# Patient Record
Sex: Male | Born: 1980 | Race: White | Hispanic: No | Marital: Married | Smoking: Never smoker
Health system: Southern US, Community
[De-identification: ages and names within clinical notes are randomized; demographics above are authoritative.]

## PROBLEM LIST (undated history)

## (undated) DIAGNOSIS — F319 Bipolar disorder, unspecified: Secondary | ICD-10-CM

---
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
## 2003-11-27 ENCOUNTER — Inpatient Hospital Stay (HOSPITAL_COMMUNITY): Admission: RE | Admit: 2003-11-27 | Discharge: 2003-12-02 | Payer: Self-pay | Admitting: Psychiatry

## 2003-11-27 ENCOUNTER — Ambulatory Visit: Payer: Self-pay | Admitting: Psychiatry

## 2003-12-10 ENCOUNTER — Emergency Department (HOSPITAL_COMMUNITY): Admission: EM | Admit: 2003-12-10 | Discharge: 2003-12-10 | Payer: Self-pay | Admitting: Emergency Medicine

## 2003-12-10 ENCOUNTER — Inpatient Hospital Stay (HOSPITAL_COMMUNITY): Admission: RE | Admit: 2003-12-10 | Discharge: 2003-12-13 | Payer: Self-pay | Admitting: Psychiatry

## 2004-03-24 ENCOUNTER — Inpatient Hospital Stay (HOSPITAL_COMMUNITY): Admission: RE | Admit: 2004-03-24 | Discharge: 2004-03-29 | Payer: Self-pay | Admitting: Psychiatry

## 2004-03-24 ENCOUNTER — Ambulatory Visit: Payer: Self-pay | Admitting: Psychiatry

## 2004-03-24 ENCOUNTER — Emergency Department (HOSPITAL_COMMUNITY): Admission: EM | Admit: 2004-03-24 | Discharge: 2004-03-24 | Payer: Self-pay | Admitting: Emergency Medicine

## 2007-01-11 ENCOUNTER — Emergency Department (HOSPITAL_COMMUNITY): Admission: EM | Admit: 2007-01-11 | Discharge: 2007-01-11 | Payer: Self-pay | Admitting: Emergency Medicine

## 2007-03-02 ENCOUNTER — Emergency Department (HOSPITAL_COMMUNITY): Admission: EM | Admit: 2007-03-02 | Discharge: 2007-03-03 | Payer: Self-pay | Admitting: Emergency Medicine

## 2008-09-14 ENCOUNTER — Emergency Department (HOSPITAL_COMMUNITY): Admission: EM | Admit: 2008-09-14 | Discharge: 2008-09-14 | Payer: Self-pay | Admitting: Emergency Medicine

## 2010-01-15 ENCOUNTER — Encounter
Admission: RE | Admit: 2010-01-15 | Discharge: 2010-01-15 | Payer: Self-pay | Source: Home / Self Care | Attending: Family Medicine | Admitting: Family Medicine

## 2010-01-19 ENCOUNTER — Encounter: Payer: Self-pay | Admitting: Family Medicine

## 2010-02-09 ENCOUNTER — Ambulatory Visit: Admit: 2010-02-09 | Payer: Self-pay | Admitting: Sports Medicine

## 2010-03-11 NOTE — Letter (Signed)
Summary: [facility] [place]   Imported By: Marily Memos 01/19/2010 14:36:32  _____________________________________________________________________  External Attachment:    Type:   Image     Comment:   External Document

## 2010-06-25 NOTE — Discharge Summary (Signed)
NAME:  Adam Mccarty, Adam Mccarty NO.:  [id number]   MEDICAL RECORD NO.:  [id number]          PATIENT TYPE:  IPS   LOCATION:  0504                          FACILITY:  [facility]   PHYSICIAN:  Adam Mccarty, M.D. DATE OF BIRTH:  02-Nov-1980   DATE OF ADMISSION:  11/27/2003  DATE OF DISCHARGE:  12/02/2003                                 DISCHARGE SUMMARY   IDENTIFYING DATA:  This is a 30 year old single Caucasian male voluntarily  admitted with a history of depression, crying, stressed out, insomnia, mood  swings, racing thoughts, using 4-5 Klonopin daily, irritable towards others.  First hospitalization to [facility].   MEDICATIONS:  Klonopin 4-5 a day.   ALCOHOL/DRUG HISTORY:  Denies history of alcohol use.  Nonsmoker.   ALLERGIES:  CODEINE.   PHYSICAL EXAMINATION:  Physical and neurological exam within normal limits.   LABORATORY DATA:  Routine admission labs within normal limits.   MENTAL STATUS EXAM:  Alert, cooperative, anxious to talk about situation.  Fair eye contact.  Speech clear.  Mood tense, anxious, dysphoric.  Thought  processes goal directed.  Denied suicidal or homicidal ideation.  No  psychotic symptoms.  Cognitively intact.  Judgment and insight fair.   ADMISSION DIAGNOSES:   AXIS I:  1.  Major depressive disorder.  2.  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   AXIS II:  Deferred.   AXIS III:  None.   AXIS IV:  Significant stressors regarding housing issues, economic and other  psychosocial problems.   AXIS V:   HOSPITAL COURSE:  The patient was admitted and ordered routine p.r.n.  medications and underwent further monitoring.  Was encouraged to participate  in individual, group and milieu therapy.  The patient was evaluated and mood  stabilizing medication of Depakote and Zyprexa were ordered after  risk/benefit ratio and alternative treatments were discussed with patient.  The patient was agreeable.  The patient admitted to homicidal  thoughts,  feeling that he would bash in someone's head if they set him off.  Complained of severe insomnia and significant mood swings with anger, rage  and explosive outbursts.  Also feeling that he would be better off dead.  Describing passive suicidal ideation.  The patient reported visual  hallucinations decreasing and decrease in withdrawal symptoms as he was  gradually stabilized on medications and received Librium p.r.n. for detox  initially.  Seroquel and lithium were optimized.  Levels were closely  monitored.  Family session was held.  Mother reported that patient had  significantly improved, was not talking about any suicidal thoughts.  There  was concern about the patient returning to live with the uncle.  Therefore,  the patient was agreeing to live with the mother and was willing to work on  anger management skills.  The patient's mother and uncle agreed to monitor  patient's medications and that all weapons had been secured.  Mother was  agreeable to monitor medications due to the nature of medications.  This and  other safety issues were clarified.   CONDITION ON DISCHARGE:  The patient was discharged in improved condition.  Mood was  euthymic.  Affect full.  Thought processes goal directed.  Thought  content negative for dangerous ideation or psychotic symptoms.  The patient  was given medication education.   DISCHARGE MEDICATIONS:  1.  Zyprexa Zydis 5 mg q.a.m. and 15 mg q.h.s.,  2.  Seroquel 200 mg q.h.s.  3.  Lithobid 300 mg q.a.m. and 600 mg q.h.s.   FOLLOW UP:  [facility] the day after discharge.   DISCHARGE DIAGNOSES:   AXIS I:  1.  Major depressive disorder.  2.  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   AXIS II:  Deferred.   AXIS III:  None.   AXIS IV:  Significant stressors regarding housing issues, economic and other  psychosocial problems.   AXIS V:  Global Assessment of Functioning on discharge 55.     Adam Mccarty   JEM/MEDQ  D:   01/18/2004  T:  01/19/2004  Job:  [id number]
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
## 2010-06-25 NOTE — Discharge Summary (Signed)
NAME:  Adam Mccarty, Adam Mccarty NO.:  [id number]   MEDICAL RECORD NO.:  [id number]          PATIENT TYPE:  IPS   LOCATION:  0301                          FACILITY:  [facility]   PHYSICIAN:  Geoffery Lyons, M.D.      DATE OF BIRTH:  [birth year]   DATE OF ADMISSION:  03/24/2004  DATE OF DISCHARGE:  03/29/2004                                 DISCHARGE SUMMARY   CHIEF COMPLAINT/HISTORY OF PRESENT ILLNESS:  This is the third admission to  [facility] for this 30 year old single white male  voluntarily admitted with history of homicidal thoughts.  Say something, he  will resort to violence.  Increased mood swings, having paranoid ideas,  feels like people were talking about him and did not trust anyone.  Reporting increases of benzodiazepine taking 3 a day for a month.  He took 5  the day before just to get a buzz before he was admitted.  Using cocaine and  marijuana, sleeping fairly well.   PAST PSYCHIATRIC HISTORY:  Third time a [employer].  He was to  go to [facility] on his last discharge, but he was not able to do so due  to transportation.  He was admitted in November 2005 for benzodiazepine  abuse, racing thoughts and irritability.  He was seen in October 2005 for  Klonopin and alcohol use.  He reports he is still bipolar.   ALCOHOL OR DRUG HISTORY:  Persistent use of alcohol, benzodiazepine,  marijuana and cocaine.   PAST MEDICAL HISTORY:  Noncontributory.   MEDICATIONS:  He has been on lithium in the past.  No current medications.   PHYSICAL EXAMINATION:  Performed which did not show any acute findings.   LABORATORY WORKUP:  Urine drug screen positive for cocaine, marijuana,  benzodiazepine.  CBC within normal limits.  CMET was within normal limits.  TSH 1.377.   MENTAL STATUS EXAM:  Revealed an alert male, cooperative.  Fair eye contact.  Somewhat disheveled.  Speech was clear.  Feeling very helpless, hopeless,  labile.  Thought  processes were logical, coherent and relevant.  There was  some paranoid ideation, some paranoid thoughts, some homicidal thoughts but  very vague.  No suicidal ideations, no hallucinations.  Cognition well  preserved.   ADMISSION DIAGNOSES:   AXIS I:  1.  Bipolar disorder, rule out psychotic features.  2.  Polysubstance abuse.   AXIS II:  No diagnosis.   AXIS III:  No diagnosis.   AXIS IV:  Moderate.   AXIS V:  Global assessment of function on admission 25, highest in the last  year 60.   COURSE IN HOSPITAL:  He was admitted, started in individual and group  psychotherapy.  He was given Ambien for sleep.  He was given Librium as  needed for symptoms of withdrawal.  He started on Depakote ER 250 in the  morning and 500 at night.  Risperdal 0.25 in the morning and 0.5 at night.  He was given Rozarem for sleep.  He endorsed persistent difficulty with  mood, endorsed irritability, anger, easy loss of  control.  He claimed that  the lithium caused tremors, but he felt better.  He wanted to try some other  medications. He tolerated the combination of Depakote and Risperdal well.  He had an episode where he reverted to his old behavior.  He got medication,  and he felt much better.  Overall he was able to tolerate the medication  well.  He had work on Building surveyor, work on Pharmacologist, stress  management, conflicts and solutions, and on March 29, 2004 he was in full  contact with reality.  There were no suicidal ideas, no homicidal ideation,  no hallucinations, no delusions.  He was feeling much better.  There was no  evidence of mood swings, appropriate control of anger, overall much  improved.  He was able to talk with the mother and her boyfriend.  They were  very supportive of him.  As he was stable enough, we went ahead and  discharged him to outpatient followup.   DISCHARGE DIAGNOSES:   AXIS I:  1.  Bipolar disorder, not otherwise specified.  2.  Polysubstance  abuse.   AXIS II:  No diagnosis.   AXIS III:  No diagnosis.   AXIS IV:  Moderate.   AXIS IV:  Global assessment of function on discharge 50-55.   DISCHARGE MEDICATIONS:  1.  Depakote ER 250 mg 1 in the morning and 2 at night.  2.  Risperdal 0.5 mg 1/2 table twice a day and 1 at night.  3.  Rozarem 8 mg at night.   FOLLOW UP:  [facility].   Valproic acid level upon discharge was 51.8.      IL/MEDQ  D:  04/27/2004  T:  04/28/2004  Job:  [id number]

## 2010-10-28 LAB — DIFFERENTIAL
Basophils Absolute: 0
Basophils Relative: 1
Eosinophils Absolute: 0.2
Lymphocytes Relative: 36
Lymphs Abs: 2.2
Monocytes Relative: 12
Neutro Abs: 3

## 2010-10-28 LAB — BASIC METABOLIC PANEL
BUN: 9
Creatinine, Ser: 1.14
GFR calc non Af Amer: >60
Glucose, Bld: 81

## 2010-10-28 LAB — CBC
HCT: 42.6
Hemoglobin: 14.5
RBC: 4.99
WBC: 6.2

## 2010-10-28 LAB — HEPATIC FUNCTION PANEL
AST: 22
Albumin: 4.2
Alkaline Phosphatase: 46
Total Bilirubin: 0.9
Total Protein: 6.4

## 2011-02-16 ENCOUNTER — Encounter (HOSPITAL_COMMUNITY): Payer: Self-pay | Admitting: *Deleted

## 2011-02-16 ENCOUNTER — Emergency Department (HOSPITAL_COMMUNITY)
Admission: EM | Admit: 2011-02-16 | Discharge: 2011-02-16 | Disposition: A | Discharge status: Home / Self Care | Payer: BC Managed Care – PPO | Attending: Emergency Medicine | Admitting: Emergency Medicine

## 2011-02-16 ENCOUNTER — Emergency Department (HOSPITAL_COMMUNITY): Payer: BC Managed Care – PPO

## 2011-02-16 DIAGNOSIS — M549 Dorsalgia, unspecified: Secondary | ICD-10-CM | POA: Insufficient documentation

## 2011-02-16 MED ORDER — IBUPROFEN 800 MG PO TABS: 800.0000 mg | ORAL_TABLET | Freq: Three times a day (TID) | ORAL | Status: AC

## 2011-02-16 MED ORDER — HYDROCODONE-IBUPROFEN 7.5-200 MG PO TABS: 1.0000 | ORAL_TABLET | Freq: Four times a day (QID) | ORAL | Status: AC | PRN

## 2011-02-16 MED ORDER — HYDROCODONE-ACETAMINOPHEN 5-325 MG PO TABS
2.0000 | ORAL_TABLET | Freq: Once | ORAL | Status: AC
  Administered 2011-02-16: 2 via ORAL
  Filled 2011-02-16: qty 2

## 2011-02-16 NOTE — ED Notes (Signed)
Pt c/o mid back pain described as stabbing pain. Pt states pain has been persistent for 3 months. Pt denies any known injury but states he did have a job that required a lot of lifting.

## 2011-02-16 NOTE — ED Provider Notes (Addendum)
History   This chart was scribed for [employer]. Colon Branch, MD scribed by Magnus Sinning. The patient was seen in room APA03/APA03 seen at 9:59.    CSN: [id number]  Arrival date & time 02/16/11  0906   First MD Initiated Contact with Patient 02/16/11 [phone]      Chief Complaint  Patient presents with  . Back Pain    (Consider location/radiation/quality/duration/timing/severity/associated sxs/prior treatment) HPI Adam Mccarty is a 31 y.o. male who presents to the Emergency Department complaining of gradually worsening constant back pain located on the paraspinal muscle on his back onset 6 months ago. Pt says he works nights and that when he wakes up in the evening it hurts really bad and that he has difficulty sleeping because of uncomfortable pain. He says that he has been taking ibuprofen 2x a day with mild improvement, with it last being taken at 8 AM this morning. He adds that his pain is aggravated when he bends or twists, describing it as a "sharp pain that shoots to his chest." He attributes his back pain to a possible injury from doing heavy lifting at work in the summer and assumes that he may have injured his back doing that. Denies any other pertinent symptoms.  History reviewed. No pertinent past medical history.  History reviewed. No pertinent past surgical history.  History reviewed. No pertinent family history.  History  Substance Use Topics  . Smoking status: Never Smoker   . Smokeless tobacco: Not on file  . Alcohol Use: No      Review of Systems 10 Systems reviewed and are negative for acute change except as noted in the HPI. Allergies  Review of patient's allergies indicates no known allergies.  Home Medications  No current outpatient prescriptions on file.  BP 136/78  Pulse 88  Temp(Src) 97.9 F (36.6 C) (Oral)  Resp 16  Ht 6' (1.829 m)  Wt 165 lb (74.844 kg)  BMI 22.38 kg/m2  SpO2 97%  Physical Exam  Nursing note and vitals reviewed. Constitutional:  He is oriented to person, place, and time. He appears well-developed and well-nourished. No distress.  HENT:  Head: Normocephalic and atraumatic.  Mouth/Throat: Oropharynx is clear and moist.  Eyes: EOM are normal. Pupils are equal, round, and reactive to light.  Neck: Neck supple. No tracheal deviation present.  Cardiovascular: Normal rate, regular rhythm and normal heart sounds.  Exam reveals no gallop and no friction rub.   No murmur heard. Pulmonary/Chest: Effort normal and breath sounds normal. No respiratory distress.  Abdominal: Soft. Bowel sounds are normal. He exhibits no distension.  Musculoskeletal: Normal range of motion. He exhibits no edema.       Focal tenderness at the T7-8 paraspinal spinal interface and discomfort is tracked along the T8 rib.   Neurological: He is alert and oriented to person, place, and time. No sensory deficit.  Skin: Skin is warm and dry.  Psychiatric: He has a normal mood and affect. His behavior is normal.    ED Course  Procedures (including critical care time) DIAGNOSTIC STUDIES: Oxygen Saturation is 97% on room air, normal by my interpretation.    COORDINATION OF CARE:  Dg Thoracic Spine 2 View  02/16/2011  *RADIOLOGY REPORT*  Clinical Data: 31 year old male with pain.  THORACIC SPINE - 2 VIEW  Comparison: None.  Findings: Hypoplastic twelfth ribs, otherwise normal thoracic segmentation. Cervicothoracic junction alignment is within normal limits.  Thoracic vertebral height and alignment is within normal limits.  Grossly normal visualized  thoracic visceral contours.  IMPRESSION: No acute osseous abnormality in the thoracic spine.  Original Report Authenticated By: Harley Hallmark, M.D.  New Prescriptions   HYDROCODONE-IBUPROFEN (VICOPROFEN) 7.5-200 MG PER TABLET    Take 1 tablet by mouth every 6 (six) hours as needed for pain.   IBUPROFEN (ADVIL,MOTRIN) 800 MG TABLET    Take 1 tablet (800 mg total) by mouth 3 (three) times daily.     MDM    Patient with recurrent pain to right back. Worse last few days. Does heavy lifting at work. Infrascapular pain.  Analgesic given with some relief. Xray negative for acute process.Pt feels improved after observation and/or treatment in ED.Pt stable in ED with no significant deterioration in condition.The patient appears reasonably screened and/or stabilized for discharge and I doubt any other medical condition or other [facility] requiring further screening, evaluation, or treatment in the ED at this time prior to discharge.  I personally performed the services described in this documentation, which was scribed in my presence. The recorded information has been reviewed and considered.  MDM Reviewed: nursing note and vitals Interpretation: x-ray         Nicoletta Dress. Colon Branch, MD 02/16/11 1139  Nicoletta Dress. Colon Branch, MD 02/16/11 1156  Nicoletta Dress. Colon Branch, MD 02/16/11 1204

## 2013-06-02 ENCOUNTER — Encounter (HOSPITAL_COMMUNITY): Payer: Self-pay | Admitting: Emergency Medicine

## 2013-06-02 ENCOUNTER — Emergency Department (HOSPITAL_COMMUNITY)
Admission: EM | Admit: 2013-06-02 | Discharge: 2013-06-02 | Disposition: A | Discharge status: Home / Self Care | Payer: BC Managed Care – PPO | Attending: Emergency Medicine | Admitting: Emergency Medicine

## 2013-06-02 DIAGNOSIS — F32A Depression, unspecified: Secondary | ICD-10-CM

## 2013-06-02 DIAGNOSIS — F3289 Other specified depressive episodes: Secondary | ICD-10-CM | POA: Insufficient documentation

## 2013-06-02 DIAGNOSIS — F1110 Opioid abuse, uncomplicated: Secondary | ICD-10-CM | POA: Insufficient documentation

## 2013-06-02 DIAGNOSIS — F329 Major depressive disorder, single episode, unspecified: Secondary | ICD-10-CM | POA: Insufficient documentation

## 2013-06-02 DIAGNOSIS — F1210 Cannabis abuse, uncomplicated: Secondary | ICD-10-CM | POA: Insufficient documentation

## 2013-06-02 HISTORY — DX: Bipolar disorder, unspecified: F31.9

## 2013-06-02 LAB — CBC
HCT: 41.2 % (ref 39.0–52.0)
HEMOGLOBIN: 14.2 g/dL (ref 13.0–17.0)
MCH: 29.2 pg (ref 26.0–34.0)
MCHC: 34.5 g/dL (ref 30.0–36.0)
MCV: 84.6 fL (ref 78.0–100.0)
Platelets: 148 10*3/uL — ABNORMAL LOW (ref 150–400)
RBC: 4.87 MIL/uL (ref 4.22–5.81)
RDW: 13.3 % (ref 11.5–15.5)
WBC: 4.1 10*3/uL (ref 4.0–10.5)

## 2013-06-02 LAB — RAPID URINE DRUG SCREEN, HOSP PERFORMED
AMPHETAMINES: NOT DETECTED
BARBITURATES: NOT DETECTED
Benzodiazepines: NOT DETECTED
COCAINE: NOT DETECTED
Opiates: POSITIVE — AB
TETRAHYDROCANNABINOL: POSITIVE — AB

## 2013-06-02 LAB — COMPREHENSIVE METABOLIC PANEL
ALBUMIN: 4 g/dL (ref 3.5–5.2)
ALK PHOS: 52 U/L (ref 39–117)
ALT: 13 U/L (ref 0–53)
AST: 19 U/L (ref 0–37)
BILIRUBIN TOTAL: 0.2 mg/dL — AB (ref 0.3–1.2)
BUN: 10 mg/dL (ref 6–23)
CALCIUM: 9.2 mg/dL (ref 8.4–10.5)
CO2: 27 mEq/L (ref 19–32)
Chloride: 108 mEq/L (ref 96–112)
Creatinine, Ser: 1.07 mg/dL (ref 0.50–1.35)
GFR calc Af Amer: >90 mL/min (ref >90)
GLUCOSE: 96 mg/dL (ref 70–99)
Potassium: 4.5 mEq/L (ref 3.7–5.3)
Sodium: 146 mEq/L (ref 137–147)
TOTAL PROTEIN: 6.8 g/dL (ref 6.0–8.3)

## 2013-06-02 LAB — ETHANOL: ALCOHOL ETHYL (B): 107 mg/dL — AB (ref 0–11)

## 2013-06-02 LAB — SALICYLATE LEVEL: Salicylate Lvl: 0.4 mg/dL — ABNORMAL LOW (ref 2.8–20.0)

## 2013-06-02 LAB — ACETAMINOPHEN LEVEL: Acetaminophen (Tylenol), Serum: <15 ug/mL (ref 10–30)

## 2013-06-02 NOTE — Discharge Instructions (Signed)
[facility]

## 2013-06-02 NOTE — ED Notes (Signed)
Tele psych being performed at present time, [facility] remains at bedside,
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
## 2013-06-02 NOTE — ED Provider Notes (Signed)
CSN: [id number]     Arrival date & time 06/02/13  [id number] History  This chart was scribed for Donnetta HutchingBrian Liyla Radliff, MD by Leone PayorSonum Patel, ED Scribe. This patient was seen in room [facility] and the patient's care was started 9:15 AM.    Chief Complaint  Patient presents with  . V70.1      The history is provided by the patient. No language interpreter was used.    HPI Comments: Adam Mccarty is a 33 y.o. male who presents to the Emergency Department under IVC escorted by [employer] today. Per IVC papers, patient was drinking last night while watching a war movie. Patient became depressed, had SI and auditory hallucinations. Per papers, patient was hysterical one minute and fine the next. Patient states he normally does not abuse alcohol. He has a history bipolar disorder and is [facility]. He is supposed to be taking Prozac but states he quit taking this. He admits to using marijuana and pain pills but states he has not used these in the past 1 week. Patient denies SI, HI currently. Patient works in [employer].   Past Medical History  Diagnosis Date  . Bipolar 1 disorder    History reviewed. No pertinent past surgical history. History reviewed. No pertinent family history. History  Substance Use Topics  . Smoking status: Never Smoker   . Smokeless tobacco: Not on file  . Alcohol Use: Yes     Comment: social drinker. last use last night    Review of Systems  A complete 10 system review of systems was obtained and all systems are negative except as noted in the HPI and PMH.    Allergies  Review of patient's allergies indicates no known allergies.  Home Medications   Prior to Admission medications   Medication Sig Start Date End Date Taking? Authorizing Provider  ibuprofen (ADVIL,MOTRIN) 800 MG tablet Take 800 mg by mouth every 8 (eight) hours as needed. Pain    Historical Provider, MD   BP 117/91  Pulse 85  Temp(Src) 98.1 F (36.7 C) (Oral)  Resp 16  Ht 6' (1.829 m)  Wt 158 lb  (71.668 kg)  BMI 21.42 kg/m2  SpO2 100% Physical Exam  Nursing note and vitals reviewed. Constitutional: He is oriented to person, place, and time. He appears well-developed and well-nourished.  HENT:  Head: Normocephalic and atraumatic.  Eyes: Conjunctivae and EOM are normal. Pupils are equal, round, and reactive to light.  Neck: Normal range of motion. Neck supple.  Cardiovascular: Normal rate, regular rhythm and normal heart sounds.   Pulmonary/Chest: Effort normal and breath sounds normal.  Abdominal: Soft. Bowel sounds are normal.  Musculoskeletal: Normal range of motion.  Neurological: He is alert and oriented to person, place, and time.  Skin: Skin is warm and dry.  Psychiatric: He has a normal mood and affect. His behavior is normal. Judgment and thought content normal.    ED Course  Procedures (including critical care time)  DIAGNOSTIC STUDIES: Oxygen Saturation is 100% on RA, normal by my interpretation.    COORDINATION OF CARE: 9:26 AM Will order lab work and psych consult. Discussed treatment plan with pt at bedside and pt agreed to plan.    Labs Review Labs Reviewed  CBC - Abnormal; Notable for the following:    Platelets 148 (*)    All other components within normal limits  COMPREHENSIVE METABOLIC PANEL - Abnormal; Notable for the following:    Total Bilirubin 0.2 (*)    All other  components within normal limits  ETHANOL - Abnormal; Notable for the following:    Alcohol, Ethyl (B) 107 (*)    All other components within normal limits  SALICYLATE LEVEL - Abnormal; Notable for the following:    Salicylate Lvl 0.4 (*)    All other components within normal limits  URINE RAPID DRUG SCREEN (HOSP PERFORMED) - Abnormal; Notable for the following:    Opiates POSITIVE (*)    Tetrahydrocannabinol POSITIVE (*)    All other components within normal limits  ACETAMINOPHEN LEVEL    Imaging Review No results found.   EKG Interpretation None      MDM   Final  diagnoses:  Depression    Patient is lucid. Not psychotic. No suicidal or homicidal ideation. Behavioral health consultation obtained. Patient is appropriate for outpatient therapy.  I personally performed the services described in this documentation, which was scribed in my presence. The recorded information has been reviewed and is accurate.   Donnetta HutchingBrian Laithan Conchas, MD 06/02/13 1021
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
## 2013-06-02 NOTE — ED Notes (Signed)
Dr Adriana Simasook filled out Examination and Recommendation to cancel pt's IVC paperwork, pt deemed not to meet criteria for inpatient commitment, copies of all paperwork made and kept in er and copies given to [employer] given to officer, pt transported to residence by [employer]<

## 2013-06-02 NOTE — ED Notes (Signed)
Pt presents to er with [employer] under IVC. According to paperwork pt was watching a war movie last night and drinking, became depressed, crying and screaming one minute and fine the next, was hearing voices and threatening to kill himself and children, pt states that he was drinking last night and does not remember much of last night at all, has hx of bipolar and is suppose to be taking prozac 10 mg but had stopped taking it because it did not seem to be working anymore, pt reports that he has felt more irritable "lately" and called [facility] to have an appointment made for re-evaluation, is scheduled to see [facility] on may 1 according to pt, pt denies any thoughts of SI/HI, denies any auditory or visual hallucinations, pt cooperative at present, [employer] remains at bedside,

## 2013-06-02 NOTE — ED Notes (Addendum)
Pt wanded by security after changing into blue scrubs.

## 2013-07-25 ENCOUNTER — Encounter (HOSPITAL_COMMUNITY): Payer: Self-pay | Admitting: Emergency Medicine

## 2013-07-25 ENCOUNTER — Emergency Department (HOSPITAL_COMMUNITY)
Admission: EM | Admit: 2013-07-25 | Discharge: 2013-07-25 | Disposition: A | Discharge status: Home / Self Care | Payer: 59 | Attending: Emergency Medicine | Admitting: Emergency Medicine

## 2013-07-25 DIAGNOSIS — K029 Dental caries, unspecified: Secondary | ICD-10-CM | POA: Insufficient documentation

## 2013-07-25 DIAGNOSIS — K0889 Other specified disorders of teeth and supporting structures: Secondary | ICD-10-CM

## 2013-07-25 DIAGNOSIS — Z8659 Personal history of other mental and behavioral disorders: Secondary | ICD-10-CM | POA: Insufficient documentation

## 2013-07-25 DIAGNOSIS — R51 Headache: Secondary | ICD-10-CM | POA: Insufficient documentation

## 2013-07-25 DIAGNOSIS — K006 Disturbances in tooth eruption: Secondary | ICD-10-CM | POA: Insufficient documentation

## 2013-07-25 DIAGNOSIS — K089 Disorder of teeth and supporting structures, unspecified: Secondary | ICD-10-CM | POA: Insufficient documentation

## 2013-07-25 MED ORDER — ACETAMINOPHEN 325 MG PO TABS
650.0000 mg | ORAL_TABLET | Freq: Once | ORAL | Status: AC
  Administered 2013-07-25: 650 mg via ORAL
  Filled 2013-07-25: qty 2

## 2013-07-25 MED ORDER — IBUPROFEN 800 MG PO TABS
800.0000 mg | ORAL_TABLET | Freq: Once | ORAL | Status: AC
  Administered 2013-07-25: 800 mg via ORAL
  Filled 2013-07-25: qty 1

## 2013-07-25 MED ORDER — PENICILLIN V POTASSIUM 250 MG PO TABS
500.0000 mg | ORAL_TABLET | Freq: Once | ORAL | Status: AC
  Administered 2013-07-25: 500 mg via ORAL
  Filled 2013-07-25: qty 2

## 2013-07-25 MED ORDER — AMOXICILLIN 500 MG PO CAPS: 500.0000 mg | ORAL_CAPSULE | Freq: Three times a day (TID) | ORAL | Status: DC

## 2013-07-25 NOTE — Discharge Instructions (Signed)
Toothache  Toothaches are usually caused by tooth decay (cavity). However, other causes of toothache include:  · Gum disease.  · Cracked tooth.  · Cracked filling.  · Injury.  · Jaw problem (temporo mandibular joint or TMJ disorder).  · Tooth abscess.  · Root sensitivity.  · Grinding.  · Eruption problems.  Swelling and redness around a painful tooth often means you have a dental abscess.  Pain medicine and antibiotics can help reduce symptoms, but you will need to see a dentist within the next few days to have your problem properly evaluated and treated. If tooth decay is the problem, you may need a filling or root canal to save your tooth. If the problem is more severe, your tooth may need to be pulled.  SEEK IMMEDIATE MEDICAL CARE IF:  · You cannot swallow.  · You develop severe swelling, increased redness, or increased pain in your mouth or face.  · You have a fever.  · You cannot open your mouth adequately.  Document Released: 03/03/2004 Document Revised: 04/18/2011 Document Reviewed: 04/23/2009  ExitCare® Patient Information ©2015 [employer]. This information is not intended to replace advice given to you by your health care provider. Make sure you discuss any questions you have with your health care provider.

## 2013-07-25 NOTE — ED Notes (Signed)
Started with toothache on Tues. Needs 3 teeth pulled but doesn't have money. Here for antibiotic. C/O pain LU teeth. Has tried motrin at home but has not experienced any relief.

## 2013-07-25 NOTE — ED Provider Notes (Signed)
Medical screening examination/treatment/procedure(s) were performed by non-physician practitioner and as supervising physician I was immediately available for consultation/collaboration.   EKG Interpretation None      Iva Knapp, MD, FACEP   Iva L Knapp, MD 07/25/13 1933 

## 2013-07-25 NOTE — ED Provider Notes (Signed)
CSN: [id number]     Arrival date & time 07/25/13  1654 History   First MD Initiated Contact with Patient 07/25/13 1731     Chief Complaint  Patient presents with  . Dental Pain     (Consider location/radiation/quality/duration/timing/severity/associated sxs/prior Treatment) Patient is a 33 y.o. male presenting with tooth pain.  Dental Pain Location:  Upper Quality:  Throbbing and aching Severity:  Moderate Onset quality:  Gradual Duration:  3 days Timing:  Intermittent Progression:  Worsening Chronicity:  New Context: dental caries and poor dentition   Relieved by:  Nothing Ineffective treatments:  None tried Associated symptoms: gum swelling and headaches   Associated symptoms: no fever, no neck pain and no neck swelling   Risk factors: lack of dental care     Past Medical History  Diagnosis Date  . Bipolar 1 disorder    History reviewed. No pertinent past surgical history. History reviewed. No pertinent family history. History  Substance Use Topics  . Smoking status: Never Smoker   . Smokeless tobacco: Never Used  . Alcohol Use: Yes     Comment: social drinker    Review of Systems  Constitutional: Negative for fever and activity change.       All ROS Neg except as noted in HPI  HENT: Positive for dental problem. Negative for nosebleeds.   Eyes: Negative for photophobia and discharge.  Respiratory: Negative for cough, shortness of breath and wheezing.   Cardiovascular: Negative for chest pain and palpitations.  Gastrointestinal: Negative for abdominal pain and blood in stool.  Genitourinary: Negative for dysuria, frequency and hematuria.  Musculoskeletal: Negative for arthralgias, back pain and neck pain.  Skin: Negative.   Neurological: Positive for headaches. Negative for dizziness, seizures and speech difficulty.  Psychiatric/Behavioral: Negative for hallucinations and confusion.      Allergies  Review of patient's allergies indicates no known  allergies.  Home Medications   Prior to Admission medications   Medication Sig Start Date End Date Taking? Authorizing Provider  ibuprofen (ADVIL,MOTRIN) 800 MG tablet Take 800 mg by mouth every 8 (eight) hours as needed. Pain    Historical Provider, MD   BP 117/68  Pulse 62  Temp(Src) 97.9 F (36.6 C) (Oral)  Resp 18  Ht 6' (1.829 m)  Wt 150 lb (68.04 kg)  BMI 20.34 kg/m2  SpO2 100% Physical Exam  Nursing note and vitals reviewed. Constitutional: He is oriented to person, place, and time. He appears well-developed and well-nourished.  Non-toxic appearance.  HENT:  Head: Normocephalic.  Right Ear: Tympanic membrane and external ear normal.  Left Ear: Tympanic membrane and external ear normal.  Mouth/Throat: Uvula is midline. No trismus in the jaw. Dental caries present.  Eyes: EOM and lids are normal. Pupils are equal, round, and reactive to light.  Neck: Normal range of motion. Neck supple. Carotid bruit is not present.  Cardiovascular: Normal rate, regular rhythm, normal heart sounds, intact distal pulses and normal pulses.   Pulmonary/Chest: Breath sounds normal. No respiratory distress.  Abdominal: Soft. Bowel sounds are normal. There is no tenderness. There is no guarding.  Musculoskeletal: Normal range of motion.  Lymphadenopathy:       Head (right side): No submandibular adenopathy present.       Head (left side): No submandibular adenopathy present.    He has no cervical adenopathy.  Neurological: He is alert and oriented to person, place, and time. He has normal strength. No cranial nerve deficit or sensory deficit.  Skin: Skin is warm  and dry.  Psychiatric: He has a normal mood and affect. His speech is normal.    ED Course  Procedures (including critical care time) Labs Review Labs Reviewed - No data to display  Imaging Review No results found.   EKG Interpretation None      MDM Patient is emergency apartment with pain that started at 23 days ago. He  has been seen by a dentist and has been told that he would need to have extractions, but states he does not have the finances for this at this time. He came to the emergency department for antibiotics and assistance with his discomfort.  Vital signs are well within normal limits. There is some swelling of the gum, but no visible abscess. There is no evidence for Ludwig's Angina.the plan at this time is for the patient to be placed on amoxicillin 500 mg 3 times daily. Patient is again encouraged to see the dentist as sone as possible.    Final diagnoses:  None    *I have reviewed nursing notes, vital signs, and all appropriate lab and imaging results for this patient.Kathie Dike**    Hobson M Bryant, PA-C 07/25/13 1805

## 2013-07-25 NOTE — ED Notes (Signed)
Patient c/o left upper dental pain that started 2 days ago. Per patient some swelling noted. Patient reports taking ibuprofen with no relief. Per patient last took the ibuprofen 1.5 hours ago.

## 2015-05-06 ENCOUNTER — Encounter (HOSPITAL_COMMUNITY): Payer: Self-pay | Admitting: *Deleted

## 2015-05-06 ENCOUNTER — Emergency Department (HOSPITAL_COMMUNITY)
Admission: EM | Admit: 2015-05-06 | Discharge: 2015-05-06 | Disposition: A | Discharge status: Home / Self Care | Payer: BLUE CROSS/BLUE SHIELD | Attending: Emergency Medicine | Admitting: Emergency Medicine

## 2015-05-06 ENCOUNTER — Emergency Department (HOSPITAL_COMMUNITY): Payer: BLUE CROSS/BLUE SHIELD

## 2015-05-06 DIAGNOSIS — M25551 Pain in right hip: Secondary | ICD-10-CM | POA: Insufficient documentation

## 2015-05-06 DIAGNOSIS — F315 Bipolar disorder, current episode depressed, severe, with psychotic features: Secondary | ICD-10-CM | POA: Insufficient documentation

## 2015-05-06 MED ORDER — CYCLOBENZAPRINE HCL 10 MG PO TABS
10.0000 mg | ORAL_TABLET | Freq: Once | ORAL | Status: AC
  Administered 2015-05-06: 10 mg via ORAL
  Filled 2015-05-06: qty 1

## 2015-05-06 MED ORDER — DICLOFENAC SODIUM 50 MG PO TBEC: 50.0000 mg | DELAYED_RELEASE_TABLET | Freq: Two times a day (BID) | ORAL | Status: DC

## 2015-05-06 NOTE — ED Notes (Signed)
Pt reports leg pain in right leg. 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 States pain started Saturday and is progressing. Denies any back pain. Reports pain starts in right upper thigh and goes to right knee. Pt ambulatory to triage.

## 2015-05-06 NOTE — ED Provider Notes (Signed)
CSN: [id number]     Arrival date & time 05/06/15  1751 History   First MD Initiated Contact with Patient 05/06/15 1828     Chief Complaint  Patient presents with  . Leg Pain     (Consider location/radiation/quality/duration/timing/severity/associated sxs/prior Treatment) Patient is a 35 y.o. male presenting with leg pain. The history is provided by the patient.  Leg Pain Location:  Leg Injury: no   Leg location:  R upper leg Pain details:    Onset quality:  Gradual   Duration:  4 days   Timing:  Constant   Progression:  Worsening  Adam Mccarty is a 35 y.o. male who presents to the ED with right leg pain. The pain started 4 days ago while he was at work. 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 The pain is located near the lateral aspect of the right hip and radiates down the side of his leg.  Past Medical History  Diagnosis Date  . Bipolar 1 disorder (HCC)    History reviewed. No pertinent past surgical history. History reviewed. No pertinent family history. Social History  Substance Use Topics  . Smoking status: Never Smoker   . Smokeless tobacco: Never Used  . Alcohol Use: Yes     Comment: social drinker    Review of Systems  Musculoskeletal: Positive for arthralgias.       Right leg pain  all other systems negataive    Allergies  Review of patient's allergies indicates no known allergies.  Home Medications   Prior to Admission medications   Medication Sig Start Date End Date Taking? Authorizing Provider  amoxicillin (AMOXIL) 500 MG capsule Take 1 capsule (500 mg total) by mouth 3 (three) times daily. 07/25/13   Ivery QualeHobson Bryant, PA-C  diclofenac (VOLTAREN) 50 MG EC tablet Take 1 tablet (50 mg total) by mouth 2 (two) times daily. 05/06/15   Dharma Pare Orlene OchM Adrik Khim, NP   BP 132/80 mmHg  Pulse 83  Temp(Src) 99 F (37.2 C) (Temporal)  Resp 16  Ht 6' (1.829 m)  Wt 65.772 kg  BMI 19.66 kg/m2  SpO2  100% Physical Exam  Constitutional: He is oriented to person, place, and time. He appears well-developed and well-nourished. No distress.  HENT:  Head: Normocephalic and atraumatic.  Eyes: EOM are normal.  Neck: Neck supple.  Cardiovascular: Normal rate.   Pulmonary/Chest: Effort normal.  Musculoskeletal:       Right hip: He exhibits tenderness. He exhibits normal range of motion, normal strength and no crepitus.       Legs: Tender with palpation and range of motion of the right lateral upper thigh. Pedal pulses 2+, adequate circulation, good touch sensation, equal strength lower extremities.   Neurological: He is alert and oriented to person, place, and time. No cranial nerve deficit.  Skin: Skin is warm and dry.  Psychiatric: He has a normal mood and affect. His behavior is normal.  Nursing note and vitals reviewed.   ED Course  Procedures (including critical care time) Labs Review Dg Hip Unilat With Pelvis 2-3 Views Right  05/06/2015  CLINICAL DATA:  Right hip pain.  No reported injury. EXAM: DG HIP (WITH OR WITHOUT PELVIS) 2-3V RIGHT COMPARISON:  None. FINDINGS: No fracture, dislocation or suspicious focal osseous lesion. There is amorphous focal soft tissue calcification adjacent to the superior right acetabular margin. No appreciable joint space narrowing, osteophytes or joint erosions. IMPRESSION: No  fracture or malalignment in the right hip. Amorphous focal soft tissue calcification adjacent to the superior right acetabular margin, which may indicate right hip labral pathology. Electronically Signed   By: Delbert Phenix M.D.   On: 05/06/2015 19:29     MDM  35 y.o. male with right leg pain that goes from the hip to the knee on the lateral aspect. Stable for d/c without focal neuro deficits. Will treat with NSAIDS and referral to the orthopedic doctor. Discussed with the patient and all questioned fully answered. He will return if any problems arise.   Final diagnoses:  Hip pain,  acute, right  possible Labral pathology     Janne Napoleon, NP 05/07/15 1953  Samuel Jester, DO 05/09/15 1230

## 2015-08-24 ENCOUNTER — Emergency Department (HOSPITAL_COMMUNITY)
Admission: EM | Admit: 2015-08-24 | Discharge: 2015-08-24 | Disposition: A | Discharge status: Home / Self Care | Payer: BLUE CROSS/BLUE SHIELD | Attending: Emergency Medicine | Admitting: Emergency Medicine

## 2015-08-24 ENCOUNTER — Encounter (HOSPITAL_COMMUNITY): Payer: Self-pay | Admitting: Emergency Medicine

## 2015-08-24 DIAGNOSIS — B9789 Other viral agents as the cause of diseases classified elsewhere: Secondary | ICD-10-CM

## 2015-08-24 DIAGNOSIS — F319 Bipolar disorder, unspecified: Secondary | ICD-10-CM | POA: Insufficient documentation

## 2015-08-24 DIAGNOSIS — K121 Other forms of stomatitis: Secondary | ICD-10-CM | POA: Insufficient documentation

## 2015-08-24 DIAGNOSIS — J028 Acute pharyngitis due to other specified organisms: Secondary | ICD-10-CM

## 2015-08-24 DIAGNOSIS — Z79899 Other long term (current) drug therapy: Secondary | ICD-10-CM | POA: Insufficient documentation

## 2015-08-24 LAB — CBC WITH DIFFERENTIAL/PLATELET
BASOS PCT: 0 %
Basophils Absolute: 0 10*3/uL (ref 0.0–0.1)
Eosinophils Absolute: 0.2 10*3/uL (ref 0.0–0.7)
Eosinophils Relative: 3 %
HEMATOCRIT: 40.3 % (ref 39.0–52.0)
HEMOGLOBIN: 13.3 g/dL (ref 13.0–17.0)
LYMPHS ABS: 1.2 10*3/uL (ref 0.7–4.0)
Lymphocytes Relative: 14 %
MCH: 29 pg (ref 26.0–34.0)
MCHC: 33 g/dL (ref 30.0–36.0)
MCV: 87.8 fL (ref 78.0–100.0)
MONO ABS: 0.9 10*3/uL (ref 0.1–1.0)
MONOS PCT: 10 %
NEUTROS ABS: 6.6 10*3/uL (ref 1.7–7.7)
NEUTROS PCT: 73 %
Platelets: 151 10*3/uL (ref 150–400)
RBC: 4.59 MIL/uL (ref 4.22–5.81)
RDW: 13.5 % (ref 11.5–15.5)
WBC: 8.9 10*3/uL (ref 4.0–10.5)

## 2015-08-24 LAB — RAPID STREP SCREEN (MED CTR MEBANE ONLY): Streptococcus, Group A Screen (Direct): NEGATIVE

## 2015-08-24 MED ORDER — MAGIC MOUTHWASH W/LIDOCAINE
5.0000 mL | Freq: Once | ORAL | Status: DC
  Filled 2015-08-24: qty 5

## 2015-08-24 MED ORDER — MAGIC MOUTHWASH
5.0000 mL | Freq: Once | ORAL | Status: AC
  Administered 2015-08-24: 5 mL via ORAL
  Filled 2015-08-24: qty 5

## 2015-08-24 MED ORDER — LIDOCAINE VISCOUS 2 % MT SOLN
5.0000 mL | Freq: Once | OROMUCOSAL | Status: AC
  Administered 2015-08-24: 5 mL via OROMUCOSAL
  Filled 2015-08-24: qty 15

## 2015-08-24 MED ORDER — MAGIC MOUTHWASH W/LIDOCAINE: 5.0000 mL | Freq: Three times a day (TID) | ORAL | Status: AC | PRN

## 2015-08-24 NOTE — ED Provider Notes (Signed)
CSN: [id number]     Arrival date & time 08/24/15  0820 History   First MD Initiated Contact with Patient 08/24/15 [phone]     Chief Complaint  Patient presents with  . Sore Throat     (Consider location/radiation/quality/duration/timing/severity/associated sxs/prior Treatment) Patient is a 35 y.o. male presenting with pharyngitis. The history is provided by the patient. No language interpreter was used.  Sore Throat This is a new problem. The current episode started 1 to 4 weeks ago. The problem occurs constantly. The problem has been unchanged. Associated symptoms include chills and a sore throat. Pertinent negatives include no congestion, coughing, fever, headaches, myalgias, nausea, rash or visual change. The symptoms are aggravated by swallowing. He has tried acetaminophen for the symptoms. The treatment provided mild relief.   Adam Mccarty is a 35 y.o. male who presents to the ED with sore throat and sore tongue that started 2 weeks ago. Patient states that he started with URI symptoms 2 weeks ago and took OTC medication and everything got better except the sore throat and tongue. He has noted small blisters on the bottom and left side of this tongue. His throat hurts when he swallows and his tongue hurts when he talks.   Patient denies any medical problems other than Bipolar and denies risk factors for HIV.   Past Medical History  Diagnosis Date  . Bipolar 1 disorder (HCC)    History reviewed. No pertinent past surgical history. History reviewed. No pertinent family history. Social History  Substance Use Topics  . Smoking status: Never Smoker   . Smokeless tobacco: Never Used  . Alcohol Use: Yes     Comment: social drinker    Review of Systems  Constitutional: Positive for chills. Negative for fever.  HENT: Positive for mouth sores and sore throat. Negative for congestion.   Eyes: Negative for redness.  Respiratory: Negative for cough.   Gastrointestinal: Negative for  nausea.  Musculoskeletal: Negative for myalgias and back pain.  Skin: Negative for rash.  Allergic/Immunologic: Negative for food allergies.  Neurological: Negative for headaches.  Psychiatric/Behavioral: Negative for confusion.      Allergies  Review of patient's allergies indicates no known allergies.  Home Medications   Prior to Admission medications   Medication Sig Start Date End Date Taking? Authorizing Provider  guaiFENesin (MUCINEX) 600 MG 12 hr tablet Take 1,200 mg by mouth 2 (two) times daily as needed (sore throat).   Yes Historical Provider, MD  magic mouthwash w/lidocaine SOLN Take 5 mLs by mouth 3 (three) times daily as needed for mouth pain. 08/24/15   Montanna Mcbain Orlene OchM Anne Boltz, NP   BP 124/84 mmHg  Pulse 68  Temp(Src) 98 F (36.7 C)  Resp 18  Ht 6' (1.829 m)  Wt 63.504 kg  BMI 18.98 kg/m2  SpO2 100% Physical Exam  Constitutional: He is oriented to person, place, and time. He appears well-developed and well-nourished. No distress.  HENT:  Head: Normocephalic and atraumatic.  Right Ear: Tympanic membrane normal.  Left Ear: Tympanic membrane normal.  Nose: Nose normal.  Mouth/Throat: Uvula is midline. No oropharyngeal exudate, posterior oropharyngeal edema or posterior oropharyngeal erythema.    Tongue with two tiny ulcer areas to the left side and few area similar under the tongue.   Eyes: EOM are normal.  Neck: Normal range of motion. Neck supple.  Cardiovascular: Normal rate and regular rhythm.   Pulmonary/Chest: Effort normal and breath sounds normal.  Abdominal: Soft. Bowel sounds are normal. There is no tenderness.  Musculoskeletal: Normal range of motion.  Lymphadenopathy:    He has no cervical adenopathy.  Neurological: He is alert and oriented to person, place, and time. No cranial nerve deficit.  Skin: Skin is warm and dry.  Psychiatric: He has a normal mood and affect. His behavior is normal.  Nursing note and vitals reviewed.   ED Course  Procedures  (including critical care time) Labs Review Labs Reviewed  RAPID STREP SCREEN (NOT AT [facility])  CULTURE, GROUP A STREP ([facility])  CBC WITH DIFFERENTIAL/PLATELET  HIV ANTIBODY (ROUTINE TESTING)     MDM  35 y.o. male with sore throat and sore tongue stable for d/c without swelling of the tongue or throat and no difficulty swallowing. Discussed with the patient clinical findings and plan of care and all questioned fully answered. He will follow up with ENT or return here if any problems arise.   Final diagnoses:  Mouth ulcers  Sore throat (viral)        Janne Napoleon, NP 08/24/15 1051  Blane Ohara, MD 08/25/15 [phone]

## 2015-08-24 NOTE — ED Notes (Addendum)
Patient complaining of sore throat and tongue x 2 weeks. States he took mucinex multisymptom flu medication this morning.

## 2015-08-24 NOTE — Discharge Instructions (Signed)
Take tylenol and ibuprofen as needed for pain. Use the magic mouth wash 5 ml. Swish, gargle and spit up to 3 times a day as needed for pain. If your symptoms persist follow up with ENT, Dr. Suszanne Connerseoh.

## 2015-08-25 LAB — HIV ANTIBODY (ROUTINE TESTING W REFLEX): HIV Screen 4th Generation wRfx: NONREACTIVE

## 2015-08-26 LAB — CULTURE, GROUP A STREP (THRC)

## 2021-01-07 ENCOUNTER — Ambulatory Visit
Admission: EM | Admit: 2021-01-07 | Discharge: 2021-01-07 | Disposition: A | Discharge status: Home / Self Care | Payer: Self-pay | Attending: Urgent Care | Admitting: Urgent Care

## 2021-01-07 ENCOUNTER — Ambulatory Visit (INDEPENDENT_AMBULATORY_CARE_PROVIDER_SITE_OTHER): Payer: Self-pay

## 2021-01-07 ENCOUNTER — Encounter: Payer: Self-pay | Admitting: Emergency Medicine

## 2021-01-07 ENCOUNTER — Other Ambulatory Visit: Payer: Self-pay

## 2021-01-07 DIAGNOSIS — J189 Pneumonia, unspecified organism: Secondary | ICD-10-CM

## 2021-01-07 DIAGNOSIS — F1290 Cannabis use, unspecified, uncomplicated: Secondary | ICD-10-CM

## 2021-01-07 DIAGNOSIS — Z8616 Personal history of COVID-19: Secondary | ICD-10-CM

## 2021-01-07 DIAGNOSIS — R053 Chronic cough: Secondary | ICD-10-CM

## 2021-01-07 DIAGNOSIS — R059 Cough, unspecified: Secondary | ICD-10-CM

## 2021-01-07 MED ORDER — AMOXICILLIN 500 MG PO CAPS: 1000.0000 mg | ORAL_CAPSULE | Freq: Three times a day (TID) | ORAL | 0 refills | Status: AC

## 2021-01-07 MED ORDER — AZITHROMYCIN 250 MG PO TABS: ORAL_TABLET | ORAL | 0 refills | Status: AC

## 2021-01-07 MED ORDER — BENZONATATE 100 MG PO CAPS
100.0000 mg | ORAL_CAPSULE | Freq: Three times a day (TID) | ORAL | 0 refills | Status: AC | PRN
Start: 2021-01-07 — End: ?

## 2021-01-07 MED ORDER — PROMETHAZINE-DM 6.25-15 MG/5ML PO SYRP: 5.0000 mL | ORAL_SOLUTION | Freq: Every evening | ORAL | 0 refills | Status: AC | PRN

## 2021-01-07 NOTE — ED Triage Notes (Signed)
Productive Cough x 3 week - white sputum.  Sweats at night time.  Hx of covid 2 months ago.  States back aches.

## 2021-01-07 NOTE — ED Provider Notes (Signed)
[facility]   MRN: [id number] DOB: Jun 24, 1980  Subjective:   Adam Mccarty is a 40 y.o. male presenting for 3-week history of persistent productive cough, coughing fits, sweats at night.  Cough is eliciting aches and pains.  Had COVID-19 2 months ago.  No history of asthma.  Patient was smoking marijuana but not in the past 2 to 3 weeks.  No current facility-administered medications for this encounter.  Current Outpatient Medications:    guaiFENesin (MUCINEX) 600 MG 12 hr tablet, Take 1,200 mg by mouth 2 (two) times daily as needed (sore throat)., Disp: , Rfl:    magic mouthwash w/lidocaine SOLN, Take 5 mLs by mouth 3 (three) times daily as needed for mouth pain., Disp: 100 mL, Rfl: 0   No Known Allergies  Past Medical History:  Diagnosis Date   Bipolar 1 disorder (HCC)      History reviewed. No pertinent surgical history.  History reviewed. No pertinent family history.  Social History   Tobacco Use   Smoking status: Never   Smokeless tobacco: Never  Substance Use Topics   Alcohol use: Yes    Comment: social drinker   Drug use: No    Comment: quit using marijuana x2 weeks.    ROS   Objective:   Vitals: BP 124/72 (BP Location: Right Arm)   Pulse (!) 114   Temp 99.4 F (37.4 C) (Oral)   Resp 18   SpO2 97%   Physical Exam Constitutional:      General: He is not in acute distress.    Appearance: Normal appearance. He is well-developed. He is ill-appearing. He is not toxic-appearing or diaphoretic.  HENT:     Head: Normocephalic and atraumatic.     Right Ear: External ear normal.     Left Ear: External ear normal.     Nose: Nose normal.     Mouth/Throat:     Mouth: Mucous membranes are moist.     Pharynx: Oropharynx is clear.  Eyes:     General: No scleral icterus.       Right eye: No discharge.        Left eye: No discharge.     Extraocular Movements: Extraocular movements intact.     Conjunctiva/sclera: Conjunctivae normal.      Pupils: Pupils are equal, round, and reactive to light.  Cardiovascular:     Rate and Rhythm: Normal rate and regular rhythm.     Heart sounds: Normal heart sounds. No murmur heard.   No friction rub. No gallop.  Pulmonary:     Effort: Pulmonary effort is normal. No respiratory distress.     Breath sounds: No stridor. Rhonchi (mid-lower lung fields bilaterally) present. No wheezing or rales.  Neurological:     Mental Status: He is alert and oriented to person, place, and time.  Psychiatric:        Mood and Affect: Mood normal.        Behavior: Behavior normal.        Thought Content: Thought content normal.        Judgment: Judgment normal.    DG Chest 2 View  Result Date: 01/07/2021 CLINICAL DATA:  Productive cough. EXAM: CHEST - 2 VIEW COMPARISON:  None. FINDINGS: The heart size and mediastinal contours are within normal limits. Right lung is clear. Left lingular opacity is noted concerning for possible pneumonia. The visualized skeletal structures are unremarkable. IMPRESSION: Left lingular opacity is noted concerning for possible pneumonia. Followup PA and lateral chest  X-ray is recommended in 3-4 weeks following trial of antibiotic therapy to ensure resolution and exclude underlying malignancy. Electronically Signed   By: Lupita Raider M.D.   On: 01/07/2021 08:57     Assessment and Plan :   PDMP not reviewed this encounter.  1. Community acquired pneumonia of left lung, unspecified part of lung   2. Persistent cough   3. History of COVID-19   4. Marijuana use    Will manage for community-acquired pneumonia of the left lung with dual therapy of amoxicillin and azithromycin.  Testing is pending for influenza.  Recommended supportive care otherwise.  Repeat chest x-ray in 3 to 4 weeks. Counseled patient on potential for adverse effects with medications prescribed/recommended today, ER and return-to-clinic precautions discussed, patient verbalized understanding.    Wallis Bamberg,  [place] 01/07/21 3532

## 2021-01-09 LAB — COVID-19, FLU A+B NAA
Influenza A, NAA: NOT DETECTED
Influenza B, NAA: NOT DETECTED
SARS-CoV-2, NAA: NOT DETECTED

## 2021-05-05 ENCOUNTER — Other Ambulatory Visit: Payer: Self-pay

## 2021-05-05 ENCOUNTER — Encounter (HOSPITAL_COMMUNITY): Payer: Self-pay | Admitting: *Deleted

## 2021-05-05 ENCOUNTER — Emergency Department (HOSPITAL_COMMUNITY)
Admission: EM | Admit: 2021-05-05 | Discharge: 2021-05-05 | Disposition: A | Discharge status: Home / Self Care | Payer: Self-pay | Attending: Student | Admitting: Student

## 2021-05-05 ENCOUNTER — Emergency Department (HOSPITAL_COMMUNITY): Payer: Self-pay

## 2021-05-05 DIAGNOSIS — K529 Noninfective gastroenteritis and colitis, unspecified: Secondary | ICD-10-CM | POA: Insufficient documentation

## 2021-05-05 DIAGNOSIS — K5900 Constipation, unspecified: Secondary | ICD-10-CM | POA: Insufficient documentation

## 2021-05-05 LAB — COMPREHENSIVE METABOLIC PANEL
ALT: 16 U/L (ref 0–44)
AST: 16 U/L (ref 15–41)
Albumin: 4.2 g/dL (ref 3.5–5.0)
Alkaline Phosphatase: 56 U/L (ref 38–126)
Anion gap: 12 (ref 5–15)
BUN: 9 mg/dL (ref 6–20)
CO2: 21 mmol/L — ABNORMAL LOW (ref 22–32)
Calcium: 9.6 mg/dL (ref 8.9–10.3)
Chloride: 103 mmol/L (ref 98–111)
Creatinine, Ser: 1.04 mg/dL (ref 0.61–1.24)
GFR, Estimated: >60 mL/min (ref >60)
Glucose, Bld: 139 mg/dL — ABNORMAL HIGH (ref 70–99)
Potassium: 3.4 mmol/L — ABNORMAL LOW (ref 3.5–5.1)
Sodium: 136 mmol/L (ref 135–145)
Total Bilirubin: 0.7 mg/dL (ref 0.3–1.2)
Total Protein: 6.8 g/dL (ref 6.5–8.1)

## 2021-05-05 LAB — CBC WITH DIFFERENTIAL/PLATELET
Abs Immature Granulocytes: 0.04 10*3/uL (ref 0.00–0.07)
Basophils Absolute: 0 10*3/uL (ref 0.0–0.1)
Basophils Relative: 0 %
Eosinophils Absolute: 0 10*3/uL (ref 0.0–0.5)
Eosinophils Relative: 0 %
HCT: 44.1 % (ref 39.0–52.0)
Hemoglobin: 15 g/dL (ref 13.0–17.0)
Immature Granulocytes: 0 %
Lymphocytes Relative: 9 %
Lymphs Abs: 1 10*3/uL (ref 0.7–4.0)
MCH: 29.4 pg (ref 26.0–34.0)
MCHC: 34 g/dL (ref 30.0–36.0)
MCV: 86.3 fL (ref 80.0–100.0)
Monocytes Absolute: 0.8 10*3/uL (ref 0.1–1.0)
Monocytes Relative: 7 %
Neutro Abs: 9.5 10*3/uL — ABNORMAL HIGH (ref 1.7–7.7)
Neutrophils Relative %: 84 %
Platelets: 165 10*3/uL (ref 150–400)
RBC: 5.11 MIL/uL (ref 4.22–5.81)
RDW: 13 % (ref 11.5–15.5)
WBC: 11.4 10*3/uL — ABNORMAL HIGH (ref 4.0–10.5)
nRBC: 0 % (ref 0.0–0.2)

## 2021-05-05 LAB — LIPASE, BLOOD: Lipase: 26 U/L (ref 11–51)

## 2021-05-05 MED ORDER — ONDANSETRON HCL 4 MG PO TABS: 4.0000 mg | ORAL_TABLET | Freq: Four times a day (QID) | ORAL | 0 refills | Status: AC

## 2021-05-05 MED ORDER — PANTOPRAZOLE SODIUM 40 MG IV SOLR
40.0000 mg | Freq: Once | INTRAVENOUS | Status: AC
  Administered 2021-05-05: 40 mg via INTRAVENOUS
  Filled 2021-05-05: qty 10

## 2021-05-05 MED ORDER — IOHEXOL 300 MG/ML  SOLN
100.0000 mL | Freq: Once | INTRAMUSCULAR | Status: AC | PRN
  Administered 2021-05-05: 80 mL via INTRAVENOUS

## 2021-05-05 MED ORDER — ALUM & MAG HYDROXIDE-SIMETH 200-200-20 MG/5ML PO SUSP
30.0000 mL | Freq: Once | ORAL | Status: AC
  Administered 2021-05-05: 30 mL via ORAL
  Filled 2021-05-05: qty 30

## 2021-05-05 MED ORDER — ONDANSETRON HCL 4 MG/2ML IJ SOLN
4.0000 mg | Freq: Once | INTRAMUSCULAR | Status: AC
  Administered 2021-05-05: 4 mg via INTRAVENOUS
  Filled 2021-05-05: qty 2

## 2021-05-05 MED ORDER — CIPROFLOXACIN HCL 500 MG PO TABS: 500.0000 mg | ORAL_TABLET | Freq: Two times a day (BID) | ORAL | 0 refills | Status: AC

## 2021-05-05 NOTE — ED Provider Notes (Signed)
?[facility] ?Provider Note ? ? ?CSN: [id number] ?Arrival date & time: 05/05/21  1714 ? ?  ? ?History ? ?Chief Complaint  ?Patient presents with  ? Constipation  ? ? ?Adam Mccarty is a 41 y.o. male.  With past medical history of bipolar disorder presents emergency department with complaint of constipation. ? ?Patient endorses worsening abdominal pain since Saturday.  He states that his last "good" bowel movement was on Friday and since then has had small bowel movements daily.  Denies pain with defecation.  He states that he is having nausea without vomiting.  States he is not passing gas.  He tried castor oil last night without relief of symptoms.  He denies fevers, previous abdominal surgeries, opioid or other medication use.  Denies urinary symptoms. ? ? ?Constipation ?Associated symptoms: abdominal pain and nausea   ?Associated symptoms: no dysuria and no fever   ? ?  ? ?Home Medications ?Prior to Admission medications   ?Medication Sig Start Date End Date Taking? Authorizing Provider  ?azithromycin (ZITHROMAX) 250 MG tablet Day 1: take 2 tablets. Day 2-5: Take 1 tablet daily. 01/07/21   Jaynee Eagles, PA-C  ?benzonatate (TESSALON) 100 MG capsule Take 1-2 capsules (100-200 mg total) by mouth 3 (three) times daily as needed for cough. 01/07/21   Jaynee Eagles, PA-C  ?guaiFENesin (MUCINEX) 600 MG 12 hr tablet Take 1,200 mg by mouth 2 (two) times daily as needed (sore throat).    [provider]  ?magic mouthwash w/lidocaine SOLN Take 5 mLs by mouth 3 (three) times daily as needed for mouth pain. 08/24/15   Ashley Murrain, NP  ?promethazine-dextromethorphan (PROMETHAZINE-DM) 6.25-15 MG/5ML syrup Take 5 mLs by mouth at bedtime as needed for cough. 01/07/21   Jaynee Eagles, PA-C  ?   ? ?Allergies    ?Patient has no known allergies.   ? ?Review of Systems   ?Review of Systems  ?Constitutional:  Positive for appetite change. Negative for fever.  ?Gastrointestinal:  Positive for abdominal pain,  constipation and nausea. Negative for abdominal distention.  ?Genitourinary:  Negative for dysuria.  ?All other systems reviewed and are negative. ? ?Physical Exam ?Updated Vital Signs ?BP 118/70 (BP Location: Left Arm)   Pulse 80   Temp 97.7 ?F (36.5 ?C) (Oral)   Resp 14   SpO2 100%  ?Physical Exam ?Vitals and nursing note reviewed. Exam conducted with a chaperone present.  ?Constitutional:   ?   General: He is not in acute distress. ?   Appearance: He is normal weight. He is ill-appearing. He is not toxic-appearing.  ?HENT:  ?   Head: Normocephalic and atraumatic.  ?   Mouth/Throat:  ?   Mouth: Mucous membranes are moist.  ?   Pharynx: Oropharynx is clear.  ?Eyes:  ?   General: No scleral icterus. ?   Extraocular Movements: Extraocular movements intact.  ?Cardiovascular:  ?   Rate and Rhythm: Normal rate and regular rhythm.  ?   Pulses: Normal pulses.  ?   Heart sounds: No murmur heard. ?Pulmonary:  ?   Effort: Pulmonary effort is normal. No respiratory distress.  ?   Breath sounds: Normal breath sounds.  ?Abdominal:  ?   General: Abdomen is flat. Bowel sounds are decreased. There is no distension.  ?   Palpations: Abdomen is soft.  ?   Tenderness: There is abdominal tenderness in the epigastric area. There is guarding.  ?   Hernia: No hernia is present.  ?Genitourinary: ?  Rectum: Normal. No tenderness, anal fissure, external hemorrhoid or internal hemorrhoid. Normal anal tone.  ?   Comments: No impacted stool  ?Musculoskeletal:     ?   General: Normal range of motion.  ?   Cervical back: Neck supple.  ?Skin: ?   General: Skin is warm and dry.  ?   Capillary Refill: Capillary refill takes less than 2 seconds.  ?   Coloration: Skin is pale.  ?   Findings: No rash.  ?Neurological:  ?   General: No focal deficit present.  ?   Mental Status: He is alert and oriented to person, place, and time. Mental status is at baseline.  ?Psychiatric:     ?   Mood and Affect: Mood normal.     ?   Behavior: Behavior normal.      ?   Thought Content: Thought content normal.     ?   Judgment: Judgment normal.  ? ? ?ED Results / Procedures / Treatments   ?Labs ?(all labs ordered are listed, but only abnormal results are displayed) ?Labs Reviewed  ?COMPREHENSIVE METABOLIC PANEL - Abnormal; Notable for the following components:  ?    Result Value  ? Potassium 3.4 (*)   ? CO2 21 (*)   ? Glucose, Bld 139 (*)   ? All other components within normal limits  ?CBC WITH DIFFERENTIAL/PLATELET - Abnormal; Notable for the following components:  ? WBC 11.4 (*)   ? Neutro Abs 9.5 (*)   ? All other components within normal limits  ?LIPASE, BLOOD  ? ?EKG ?None ? ?Radiology ?CT Abdomen Pelvis W Contrast ? ?Result Date: 05/05/2021 ?CLINICAL DATA:  Acute abdominal pain for several days EXAM: CT ABDOMEN AND PELVIS WITH CONTRAST TECHNIQUE: Multidetector CT imaging of the abdomen and pelvis was performed using the standard protocol following bolus administration of intravenous contrast. RADIATION DOSE REDUCTION: This exam was performed according to the departmental dose-optimization program which includes automated exposure control, adjustment of the mA and/or kV according to patient size and/or use of iterative reconstruction technique. CONTRAST:  72mL OMNIPAQUE IOHEXOL 300 MG/ML  SOLN COMPARISON:  Plain film from earlier in the same day. FINDINGS: Lower chest: No acute abnormality. Hepatobiliary: No focal liver abnormality is seen. No gallstones, gallbladder wall thickening, or biliary dilatation. Pancreas: Unremarkable. No pancreatic ductal dilatation or surrounding inflammatory changes. Spleen: Normal in size without focal abnormality. Adrenals/Urinary Tract: Adrenal glands are within normal limits. Kidneys demonstrate a normal enhancement pattern bilaterally. No renal calculi or obstructive changes are noted. Stomach/Bowel: Colon is predominately decompressed although some edematous changes in the colonic wall are noted diffusely throughout the colon with some  mild pericolonic inflammatory change. This likely represents a mild degree of pancolitis. The appendix is within normal limits. Small bowel and stomach are unremarkable. Vascular/Lymphatic: No significant vascular findings are present. No enlarged abdominal or pelvic lymph nodes. Reproductive: Prostate is unremarkable. Other: No abdominal wall hernia or abnormality. No abdominopelvic ascites. Musculoskeletal: No acute or significant osseous findings. IMPRESSION: Mild pain colitis with pericolonic inflammatory change. No abscess or perforation is noted. No other focal abnormality is noted. Electronically Signed   By: Inez Catalina M.D.   On: 05/05/2021 19:56  ? ?DG Abd Acute W/Chest ? ?Result Date: 05/05/2021 ?CLINICAL DATA:  Constipation and abdominal cramping over the last several days. EXAM: DG ABDOMEN ACUTE WITH 1 VIEW CHEST COMPARISON:  None. FINDINGS: Bowel gas pattern is normal without evidence of ileus, obstruction or free air. There does not appear to  be an abnormal amount of fecal matter. No abnormal calcifications or bone findings. One-view chest is normal with normal heart and mediastinal shadows and clear lungs. No free air. IMPRESSION: Negative acute abdominal series. Normal bowel gas pattern with grossly normal amount of fecal matter. Electronically Signed   By: Nelson Chimes M.D.   On: 05/05/2021 18:36   ? ?Procedures ?Procedures  ? ?Medications Ordered in ED ?Medications  ?ondansetron (ZOFRAN) injection 4 mg (4 mg Intravenous Given 05/05/21 1905)  ?alum & mag hydroxide-simeth (MAALOX/MYLANTA) 200-200-20 MG/5ML suspension 30 mL (30 mLs Oral Given 05/05/21 2010)  ?pantoprazole (PROTONIX) injection 40 mg (40 mg Intravenous Given 05/05/21 2010)  ?iohexol (OMNIPAQUE) 300 MG/ML solution 100 mL (80 mLs Intravenous Contrast Given 05/05/21 1951)  ? ? ?ED Course/ Medical Decision Making/ A&P ?  ?                        ?Medical Decision Making ?Amount and/or Complexity of Data Reviewed ?Labs: ordered. ?Radiology:  ordered. ? ?Risk ?OTC drugs. ?Prescription drug management. ? ?This patient presents to the ED for concern of abdominal pain and constipation, this involves an extensive number of treatment options, and is a com

## 2021-05-05 NOTE — ED Triage Notes (Signed)
Pt in c/o constipation, generalized cramping with LBM 5 days ago that was normal, pt reports vomiting today and drank castor oil last night with no relief, reports small stools intermittenly ?

## 2021-05-05 NOTE — Discharge Instructions (Signed)
You were seen in the emergency department today for abdominal pain.  It does look like you have inflammation of your colon which can be viral.  Because you do have inflammation we are going to place you on antibiotics.  You will take ciprofloxacin twice a day over the next 7 days.  For your nausea [place] gave you Zofran which you can take every 6 hours.  Please return to the emergency department for worsening abdominal pain with fever or if you continue to have vomiting despite using Zofran and are unable to drink any fluids. ?

## 2023-02-24 IMAGING — DX DG CHEST 2V
2 series · 2 of 2 positions shown · non-contrast
Comparison: None.

CLINICAL DATA: Productive cough.

EXAM:
CHEST - 2 VIEW

[chest pa]
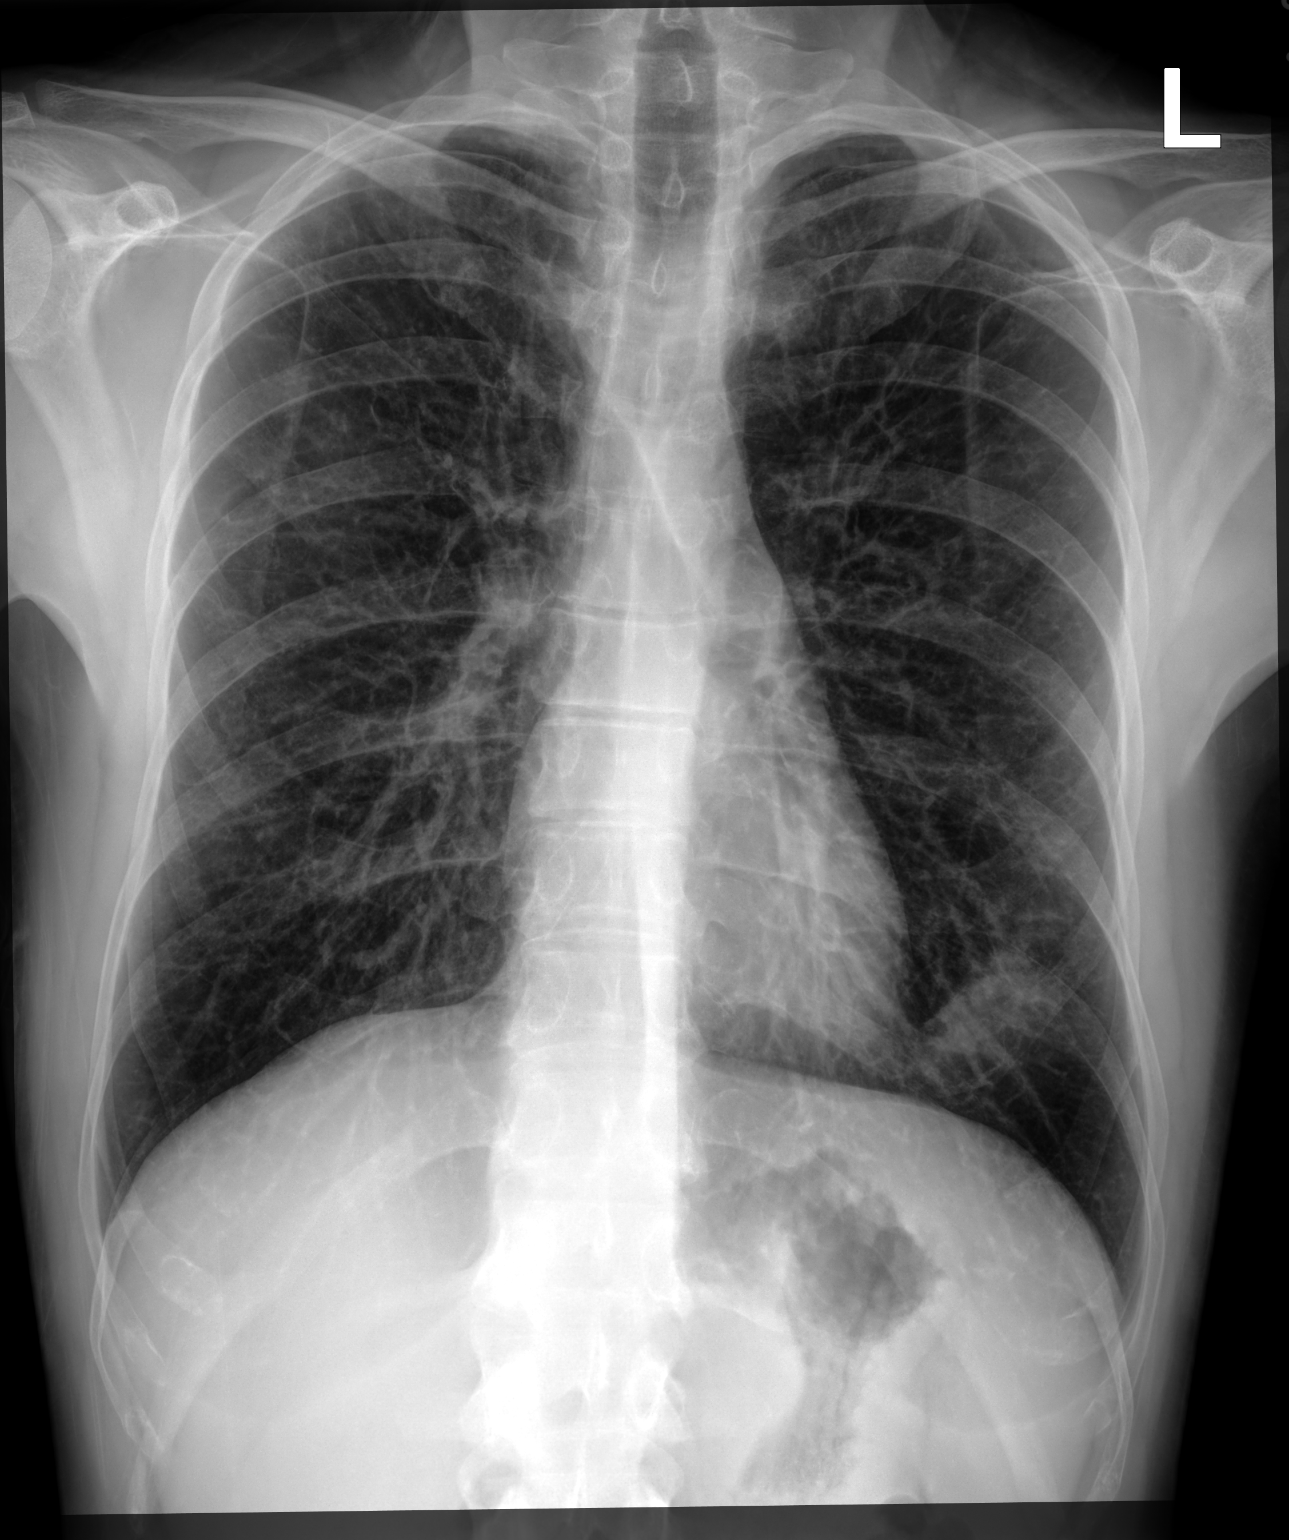

[chest lat]
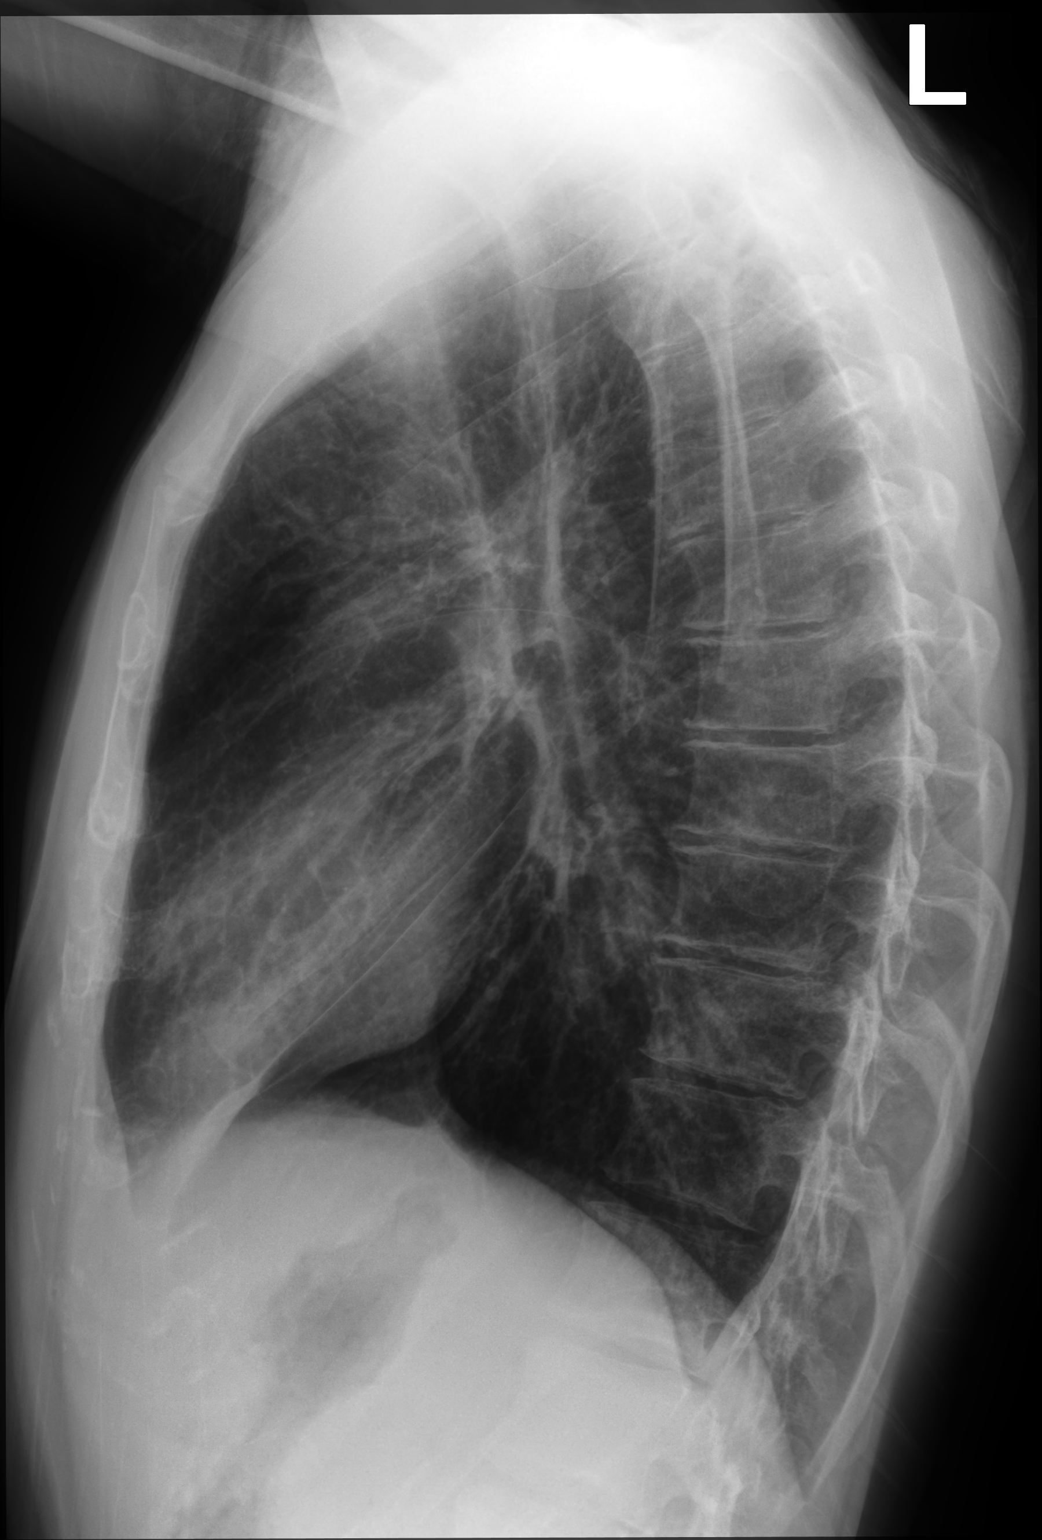

[2 of 2 positions shown; findings below may reference images not displayed]

FINDINGS: The heart size and mediastinal contours are within normal limits.
Right lung is clear. Left lingular opacity is noted concerning for
possible pneumonia. The visualized skeletal structures are
unremarkable.
IMPRESSION: Left lingular opacity is noted concerning for possible pneumonia.
Followup PA and lateral chest X-ray is recommended in 3-4 weeks
following trial of antibiotic therapy to ensure resolution and
exclude underlying malignancy.
